# Patient Record
Sex: Male | Born: 1955 | Race: Black or African American | Hispanic: No | Marital: Married | Smoking: Current some day smoker
Health system: Southern US, Community
[De-identification: ages and names within clinical notes are randomized; demographics above are authoritative.]

## PROBLEM LIST (undated history)

## (undated) DIAGNOSIS — I10 Essential (primary) hypertension: Secondary | ICD-10-CM

## (undated) DIAGNOSIS — Z860100 Personal history of colon polyps, unspecified: Secondary | ICD-10-CM

## (undated) DIAGNOSIS — K219 Gastro-esophageal reflux disease without esophagitis: Secondary | ICD-10-CM

## (undated) DIAGNOSIS — C61 Malignant neoplasm of prostate: Secondary | ICD-10-CM

## (undated) DIAGNOSIS — Z9889 Other specified postprocedural states: Secondary | ICD-10-CM

## (undated) HISTORY — DX: Gastro-esophageal reflux disease without esophagitis: K21.9

## (undated) HISTORY — DX: Essential (primary) hypertension: I10

## (undated) HISTORY — PX: DENTAL SURGERY: SHX609

## (undated) HISTORY — DX: Personal history of colonic polyps: Z86.010

## (undated) HISTORY — PX: INSERTION PROSTATE RADIATION SEED: SUR718

## (undated) HISTORY — DX: Malignant neoplasm of prostate: C61

## (undated) HISTORY — DX: Personal history of colon polyps, unspecified: Z86.0100

## (undated) HISTORY — DX: Other specified postprocedural states: Z98.890

---

## 2013-06-22 ENCOUNTER — Other Ambulatory Visit: Payer: Self-pay | Admitting: *Deleted

## 2013-06-22 ENCOUNTER — Other Ambulatory Visit: Payer: Self-pay | Admitting: Specialist

## 2013-06-22 DIAGNOSIS — C61 Malignant neoplasm of prostate: Secondary | ICD-10-CM

## 2013-06-28 ENCOUNTER — Ambulatory Visit
Admission: RE | Admit: 2013-06-28 | Discharge: 2013-06-28 | Disposition: A | Discharge status: Home / Self Care | Source: Ambulatory Visit | Attending: *Deleted | Admitting: *Deleted

## 2013-06-28 DIAGNOSIS — C61 Malignant neoplasm of prostate: Secondary | ICD-10-CM

## 2015-04-04 ENCOUNTER — Encounter: Payer: Self-pay | Admitting: Gastroenterology

## 2015-05-13 ENCOUNTER — Ambulatory Visit (AMBULATORY_SURGERY_CENTER): Payer: Self-pay

## 2015-05-13 VITALS — Ht 69.0 in | Wt 175.0 lb

## 2015-05-13 DIAGNOSIS — Z860100 Personal history of colon polyps, unspecified: Secondary | ICD-10-CM

## 2015-05-13 NOTE — Progress Notes (Signed)
No allergies to eggs or soy No past problems with anesthesia No diet meds No home oxygen  Has email and internet; refused emmi

## 2015-05-14 ENCOUNTER — Encounter: Payer: Self-pay | Admitting: Gastroenterology

## 2015-05-27 ENCOUNTER — Encounter: Payer: Self-pay | Admitting: Gastroenterology

## 2015-05-27 ENCOUNTER — Ambulatory Visit (AMBULATORY_SURGERY_CENTER): Admitting: Gastroenterology

## 2015-05-27 VITALS — BP 113/64 | HR 70 | Temp 97.5°F | Resp 20 | Ht 69.0 in | Wt 175.0 lb

## 2015-05-27 DIAGNOSIS — D123 Benign neoplasm of transverse colon: Secondary | ICD-10-CM

## 2015-05-27 DIAGNOSIS — D122 Benign neoplasm of ascending colon: Secondary | ICD-10-CM | POA: Diagnosis not present

## 2015-05-27 DIAGNOSIS — Z86010 Personal history of colonic polyps: Secondary | ICD-10-CM

## 2015-05-27 DIAGNOSIS — D120 Benign neoplasm of cecum: Secondary | ICD-10-CM

## 2015-05-27 MED ORDER — SODIUM CHLORIDE 0.9 % IV SOLN: 500.0000 mL | INTRAVENOUS | Status: DC

## 2015-05-27 NOTE — Progress Notes (Signed)
Called to room to assist during endoscopic procedure.  Patient ID and intended procedure confirmed with present staff. Received instructions for my participation in the procedure from the performing physician.  

## 2015-05-27 NOTE — Patient Instructions (Signed)
YOU HAD AN ENDOSCOPIC PROCEDURE TODAY AT [facility]:   Refer to the procedure report that was given to you for any specific questions about what was found during the examination.  If the procedure report does not answer your questions, please call your gastroenterologist to clarify.  If you requested that your care partner not be given the details of your procedure findings, then the procedure report has been included in a sealed envelope for you to review at your convenience later.  YOU SHOULD EXPECT: Some feelings of bloating in the abdomen. Passage of more gas than usual.  Walking can help get rid of the air that was put into your GI tract during the procedure and reduce the bloating. If you had a lower endoscopy (such as a colonoscopy or flexible sigmoidoscopy) you may notice spotting of blood in your stool or on the toilet paper. If you underwent a bowel prep for your procedure, you may not have a normal bowel movement for a few days.  Please Note:  You might notice some irritation and congestion in your nose or some drainage.  This is from the oxygen used during your procedure.  There is no need for concern and it should clear up in a day or so.  SYMPTOMS TO REPORT IMMEDIATELY:   Following lower endoscopy (colonoscopy or flexible sigmoidoscopy):  Excessive amounts of blood in the stool  Significant tenderness or worsening of abdominal pains  Swelling of the abdomen that is new, acute  Fever of 100F or higher   For urgent or emergent issues, a gastroenterologist can be reached at any hour by calling [phone].   DIET: Your first meal following the procedure should be a small meal and then it is ok to progress to your normal diet. Heavy or fried foods are harder to digest and may make you feel nauseous or bloated.  Likewise, meals heavy in dairy and vegetables can increase bloating.  Drink plenty of fluids but you should avoid alcoholic beverages for 24  hours.  ACTIVITY:  You should plan to take it easy for the rest of today and you should NOT DRIVE or use heavy machinery until tomorrow (because of the sedation medicines used during the test).    FOLLOW UP: Our staff will call the number listed on your records the next business day following your procedure to check on you and address any questions or concerns that you may have regarding the information given to you following your procedure. If we do not reach you, we will leave a message.  However, if you are feeling well and you are not experiencing any problems, there is no need to return our call.  We will assume that you have returned to your regular daily activities without incident.  If any biopsies were taken you will be contacted by phone or by letter within the next 1-3 weeks.  Please call us at [phone] if you have not heard about the biopsies in 3 weeks.    SIGNATURES/CONFIDENTIALITY: You and/or your care partner have signed paperwork which will be entered into your electronic medical record.  These signatures attest to the fact that that the information above on your After Visit Summary has been reviewed and is understood.  Full responsibility of the confidentiality of this discharge information lies with you and/or your care-partner.  No aspirin, ibuprofen, naproxen, aleve, or other non-steroidal anti-inflammatory drugs for 2 weeks after polyp removal. Please read polyp and hemorrhoid handouts provided.

## 2015-05-27 NOTE — Op Note (Signed)
[facility] Patient Name: Gerald Patterson Procedure Date: 05/27/2015 1:59 PM MRN: [id number] Endoscopist: Remo Lipps P. Havery Moros , MD Age: 60 Referring MD:  Date of Birth: 10/15/1955 Gender: Male Procedure:                Colonoscopy Indications:              Screening for malignant neoplasm in the colon Medicines:                Monitored Anesthesia Care Procedure:                Pre-Anesthesia Assessment:                           - Prior to the procedure, a History and Physical                            was performed, and patient medications and                            allergies were reviewed. The patient's tolerance of                            previous anesthesia was also reviewed. The risks                            and benefits of the procedure and the sedation                            options and risks were discussed with the patient.                            All questions were answered, and informed consent                            was obtained. Prior Anticoagulants: The patient has                            taken no previous anticoagulant or antiplatelet                            agents. ASA Grade Assessment: II - A patient with                            mild systemic disease. After reviewing the risks                            and benefits, the patient was deemed in                            satisfactory condition to undergo the procedure.                           After obtaining informed consent, the colonoscope  was passed under direct vision. Throughout the                            procedure, the patient's blood pressure, pulse, and                            oxygen saturations were monitored continuously. The                            Model CF-HQ190L [id number]) scope was introduced                            through the anus and advanced to the the cecum,                            identified by appendiceal orifice and  ileocecal                            valve. The colonoscopy was performed without                            difficulty. The patient tolerated the procedure                            well. The quality of the bowel preparation was                            good. The ileocecal valve, appendiceal orifice, and                            rectum were photographed. Scope In: 2:11:44 PM Scope Out: 2:29:32 PM Scope Withdrawal Time: 0 hours 14 minutes 14 seconds  Total Procedure Duration: 0 hours 17 minutes 48 seconds  Findings:                 The perianal and digital rectal examinations were                            normal.                           Multiple medium-sized patchy angiodysplastic                            lesions without bleeding were found in the rectum.                           A 5 mm polyp was found in the ascending colon. The                            polyp was sessile. The polyp was removed with a                            cold snare. Resection and retrieval were complete.  Two sessile polyps were found in the transverse                            colon. The polyps were 3 mm in size. These polyps                            were removed with a cold snare. Resection and                            retrieval were complete.                           Non-bleeding internal hemorrhoids were found during                            retroflexion.                           The exam was otherwise without abnormality. Complications:            No immediate complications. Estimated blood loss:                            Minimal. Estimated Blood Loss:     Estimated blood loss was minimal. 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 Resected and retrieved.                           - Two 3 mm polyps in the transverse colon, removed                            with a cold snare. Resected and retrieved.                           - Non-bleeding internal hemorrhoids.                           - The examination was otherwise normal. Recommendation:           - Patient has a contact number available  for                            emergencies. The signs and symptoms of potential                            delayed complications were discussed with the                            patient. Return to normal activities tomorrow.                            Written discharge instructions were provided to the                            patient.                           - Resume previous diet.                           - Continue present medications.                           - No aspirin, ibuprofen, naproxen, or other                            non-steroidal anti-inflammatory drugs for 2 weeks                            after polyp removal.                           - Await pathology results.                           - Repeat colonoscopy is recommended for                            surveillance. The colonoscopy date will be                            determined after pathology results from today's                            exam become available for review.                           - If you have symptoms of rectal bleeding from                            angiodysplasias noted on this exam, we can                            coordinate a follow up procedure for APC albation  to treat this condition Remo Lipps P. Havery Moros, MD 05/27/2015 2:34:32 PM This report has been signed electronically.

## 2015-05-27 NOTE — Progress Notes (Signed)
A/ox3, pleased with MAC, report to RN 

## 2015-05-28 ENCOUNTER — Telehealth: Payer: Self-pay

## 2015-05-28 NOTE — Telephone Encounter (Signed)
Left message on answering machine. 

## 2015-05-31 ENCOUNTER — Encounter: Payer: Self-pay | Admitting: Gastroenterology

## 2019-03-17 ENCOUNTER — Ambulatory Visit: Attending: Internal Medicine

## 2019-03-17 DIAGNOSIS — Z23 Encounter for immunization: Secondary | ICD-10-CM

## 2019-03-17 NOTE — Progress Notes (Signed)
   Covid-19 Vaccination Clinic  Name:  Avon Brahmbhatt    MRN: [id number] DOB: [birth year]  03/17/2019  Mr. Zheng was observed post Covid-19 immunization for 15 minutes without incident. He was provided with Vaccine Information Sheet and instruction to access the V-Safe system.   Mr. Isaguirre was instructed to call 911 with any severe reactions post vaccine: Marland Kitchen Difficulty breathing  . Swelling of face and throat  . A fast heartbeat  . A bad rash all over body  . Dizziness and weakness   Immunizations Administered    Name Date Dose VIS Date Route   Pfizer COVID-19 Vaccine 03/17/2019  1:23 PM 0.3 mL 12/16/2018 Intramuscular   Manufacturer: [place]   Lot: [id number]   [place]: [id number]

## 2019-04-12 ENCOUNTER — Ambulatory Visit: Attending: Internal Medicine

## 2019-04-12 DIAGNOSIS — Z23 Encounter for immunization: Secondary | ICD-10-CM

## 2019-04-12 NOTE — Progress Notes (Signed)
   Covid-19 Vaccination Clinic  Name:  Gerald Patterson    MRN: [id number] DOB: 12/21/1955  04/12/2019  Gerald Patterson was observed post Covid-19 immunization for 15 minutes without incident. He was provided with Vaccine Information Sheet and instruction to access the V-Safe system.   Gerald Patterson was instructed to call 911 with any severe reactions post vaccine: Marland Kitchen Difficulty breathing  . Swelling of face and throat  . A fast heartbeat  . A bad rash all over body  . Dizziness and weakness   Immunizations Administered    Name Date Dose VIS Date Route   Pfizer COVID-19 Vaccine 04/12/2019  4:33 PM 0.3 mL 12/16/2018 Intramuscular   Manufacturer: [place]   Lot: [id number]   [place]: [id number]

## 2019-08-04 ENCOUNTER — Other Ambulatory Visit: Payer: Self-pay | Admitting: Ophthalmology

## 2019-08-04 ENCOUNTER — Other Ambulatory Visit: Payer: Self-pay | Admitting: Optometrist

## 2019-08-04 ENCOUNTER — Other Ambulatory Visit: Payer: Self-pay | Admitting: Surgery

## 2019-08-04 DIAGNOSIS — H47212 Primary optic atrophy, left eye: Secondary | ICD-10-CM

## 2019-08-04 DIAGNOSIS — Z77018 Contact with and (suspected) exposure to other hazardous metals: Secondary | ICD-10-CM

## 2019-09-05 ENCOUNTER — Ambulatory Visit
Admission: RE | Admit: 2019-09-05 | Discharge: 2019-09-05 | Disposition: A | Discharge status: Home / Self Care | Source: Ambulatory Visit | Attending: Ophthalmology | Admitting: Ophthalmology

## 2019-09-05 ENCOUNTER — Other Ambulatory Visit: Payer: Self-pay

## 2019-09-05 DIAGNOSIS — Z77018 Contact with and (suspected) exposure to other hazardous metals: Secondary | ICD-10-CM

## 2019-09-05 DIAGNOSIS — H47212 Primary optic atrophy, left eye: Secondary | ICD-10-CM

## 2019-09-05 MED ORDER — GADOBENATE DIMEGLUMINE 529 MG/ML IV SOLN
15.0000 mL | Freq: Once | INTRAVENOUS | Status: AC | PRN
  Administered 2019-09-05: 15 mL via INTRAVENOUS

## 2020-09-16 ENCOUNTER — Encounter: Payer: Self-pay | Admitting: Gastroenterology

## 2021-02-27 IMAGING — MR MR HEAD WO/W CM
13 series · 48 of 48 positions shown · IV contrast (multihance)
Comparison: Orbit radiographs the same day.

CLINICAL DATA: 64-year-old male with primary optic atrophy on the
left. Pressure behind the left eye.

EXAM:
MRI HEAD AND ORBITS WITHOUT AND WITH CONTRAST
TECHNIQUE: Multiplanar, multiecho pulse sequences of the brain and surrounding
structures were obtained without and with intravenous contrast.
Multiplanar, multiecho pulse sequences of the orbits and surrounding
structures were obtained including fat saturation techniques, before
and after intravenous contrast administration.
CONTRAST:  15mL MULTIHANCE GADOBENATE DIMEGLUMINE 529 MG/ML IV SOLN

[Series 2: t1_se_sag · sagittal · 5.0mm · 0.47mm/px · 1 of 26 slices shown]
[im 1/26]
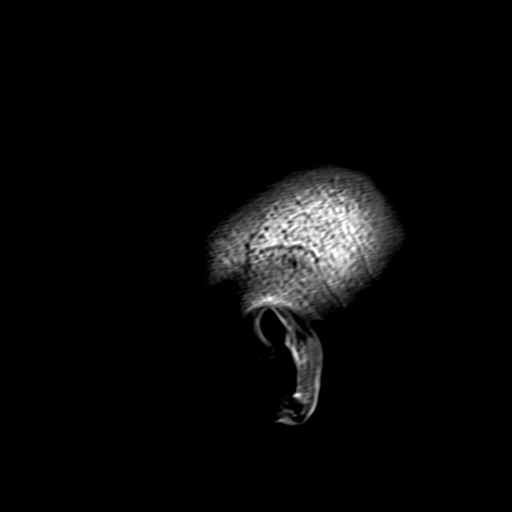

[Series 3: ep2d_diff_3 · axial · 3.0mm · 1.80mm/px · z∈[-20,+139]mm · 6 of 106 slices shown]
[im 1/106]
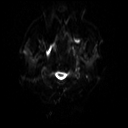
[im 22/106]
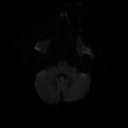
[im 43/106]
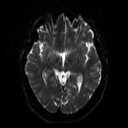
[im 64/106]
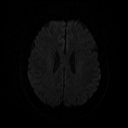
[im 85/106]
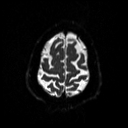
[im 106/106]
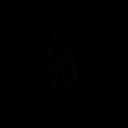

[Series 4: ep2d_diff_3_adc · axial · 3.0mm · 1.80mm/px · z∈[-20,+139]mm · 3 of 53 slices shown]
[im 1/53]
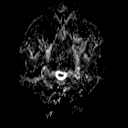
[im 27/53]
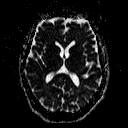
[im 53/53]
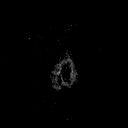

[Series 5: ep2d_diff_cor · coronal · 5.0mm · 1.77mm/px · 4 of 62 slices shown]
[im 1/62]
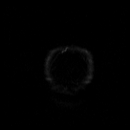
[im 21/62]
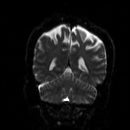
[im 41/62]
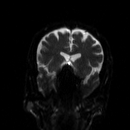
[im 62/62]
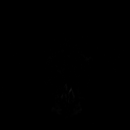

[Series 6: ep2d_diff_cor_adc · coronal · 5.0mm · 1.77mm/px · 2 of 31 slices shown]
[im 1/31]
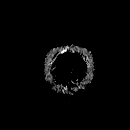
[im 31/31]
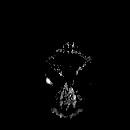

[Series 8: swi_images · axial · 2.0mm · 0.90mm/px · z∈[-13,+129]mm · 4 of 72 slices shown]
[im 1/72]
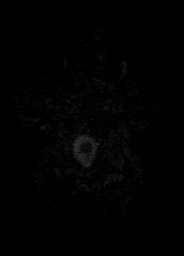
[im 24/72]
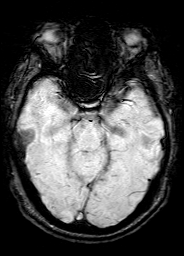
[im 48/72]
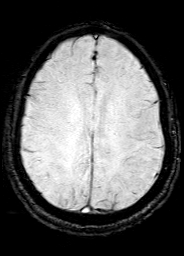
[im 72/72]
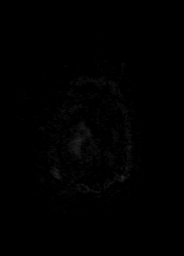

[Series 9: FLAIR · axial · 3.0mm · 0.43mm/px · z∈[-28,+140]mm · 2 of 29 slices shown]
[im 1/29]
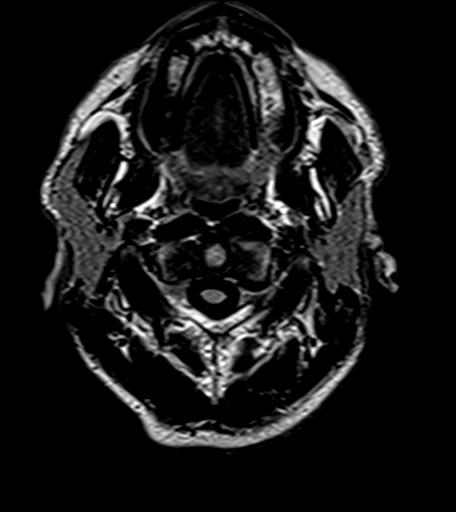
[im 29/29]
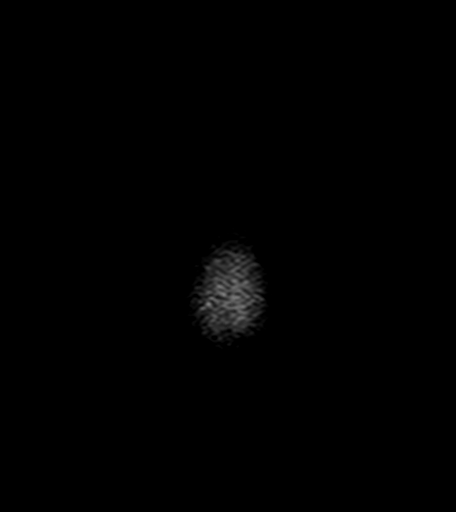

[Series 10: t2_tse_tra_512 · axial · 5.0mm · 0.60mm/px · z∈[-26,+142]mm · 2 of 29 slices shown]
[im 1/29]
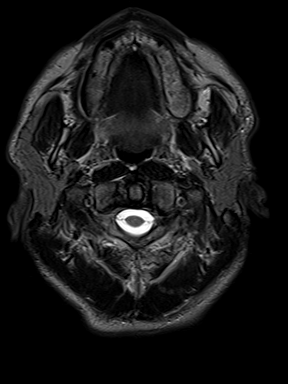
[im 29/29]
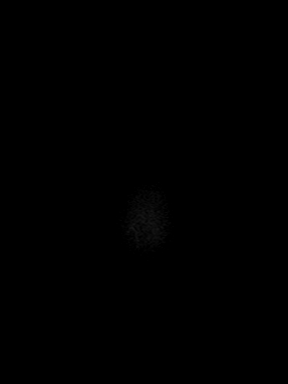

[Series 11: t1_mpr_tra · axial · 1.0mm · 0.72mm/px · z∈[-28,+146]mm · 10 of 176 slices shown]
[im 1/176]
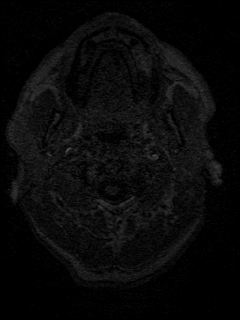
[im 20/176]
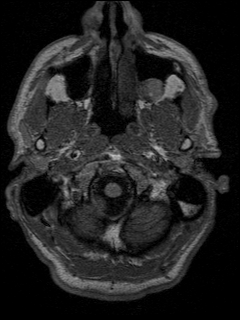
[im 39/176]
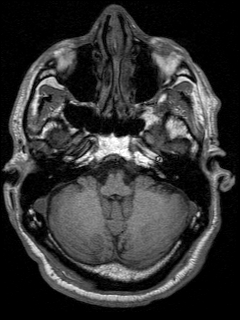
[im 59/176]
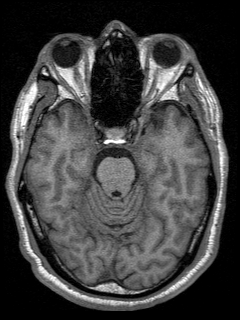
[im 78/176]
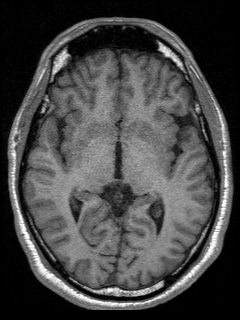
[im 98/176]
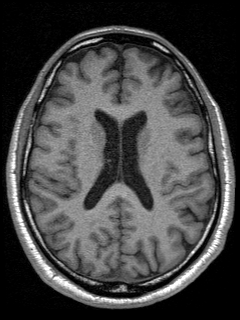
[im 117/176]
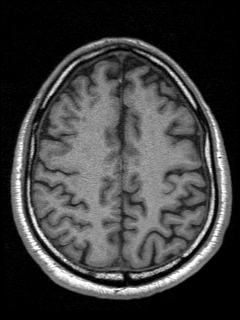
[im 137/176]
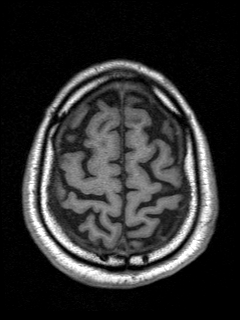
[im 156/176]
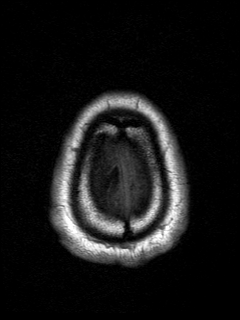
[im 176/176]
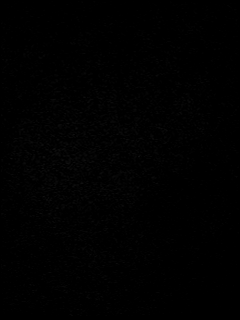

[Series 12: T2 · coronal · 5.0mm · 0.45mm/px · 2 of 34 slices shown]
[im 1/34]
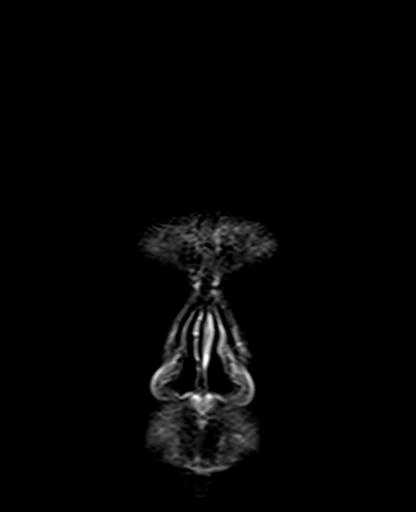
[im 34/34]
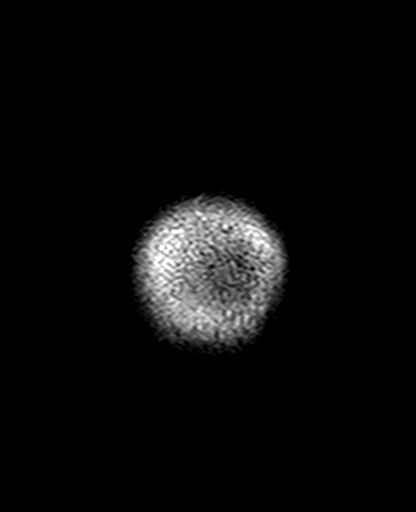

[Series 13: post t1_mpr_tra · axial · 1.0mm · 0.72mm/px · z∈[-20,+139]mm · 9 of 160 slices shown]
[im 1/160]
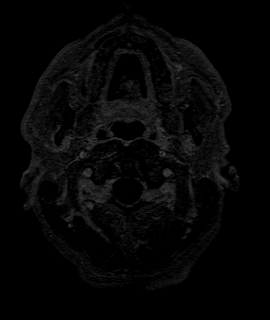
[im 20/160]
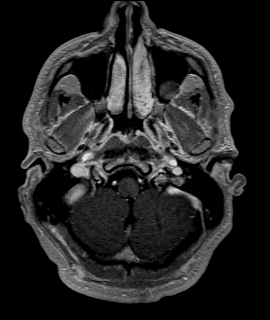
[im 40/160]
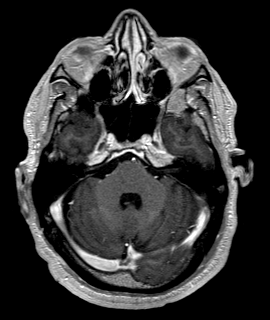
[im 60/160]
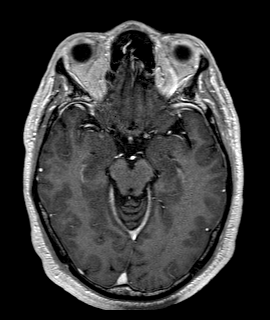
[im 80/160]
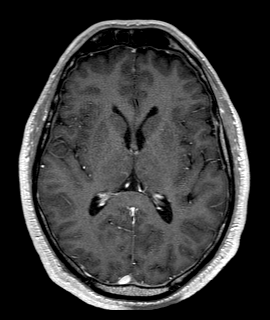
[im 100/160]
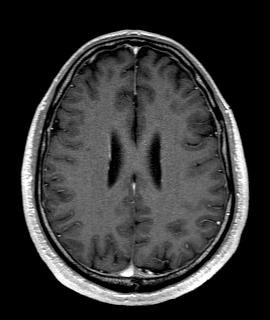
[im 120/160]
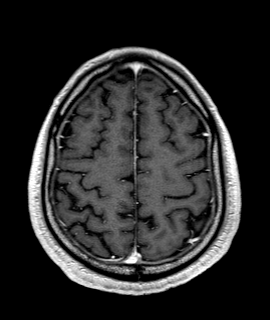
[im 140/160]
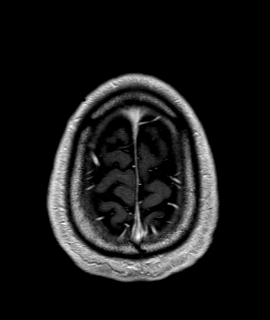
[im 160/160]
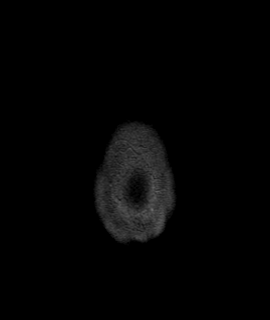

[Series 14: T1 post-contrast · coronal · 5.0mm · 0.45mm/px · 2 of 34 slices shown (1 of 2)]
[im 1/34]
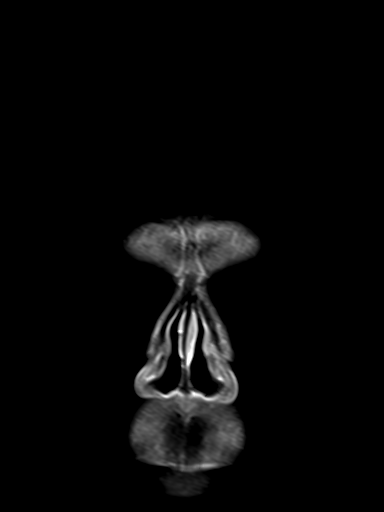
[im 34/34]
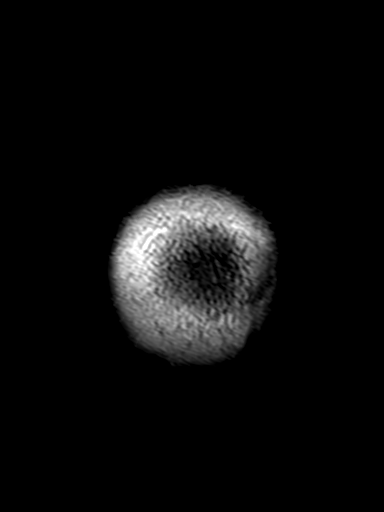

[Series 15: T1 post-contrast · sagittal · 5.0mm · 0.47mm/px · 1 of 26 slices shown (2 of 2)]
[im 1/26]
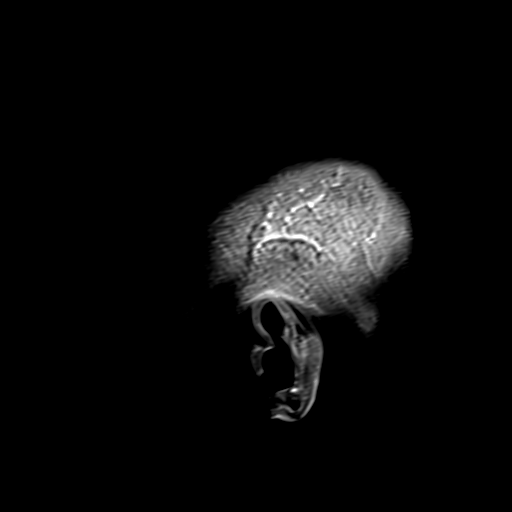

[48 of 48 positions shown; findings below may reference images not displayed]

FINDINGS: MRI HEAD FINDINGS

Brain: Cerebral volume is within normal limits for age. No
restricted diffusion to suggest acute infarction. No midline shift,
mass effect, evidence of mass lesion, ventriculomegaly, extra-axial
collection or acute intracranial hemorrhage. Cervicomedullary
junction and pituitary are within normal limits.

Mild for age scattered mostly subcortical white matter small T2 and
FLAIR hyperintense foci, nonspecific. Possible chronic
microhemorrhage in the posterior left hemisphere near the junctions
of the posterior temporal and occipital lobes on series 8, image 31.
But no cortical encephalomalacia or other chronic cerebral blood
products are identified. Deep gray nuclei, brainstem and cerebellum
appear negative.

No abnormal enhancement identified. No dural thickening.

Vascular: Major intracranial vascular flow voids are preserved. The
major dural venous sinuses are enhancing and appear to be patent.

Skull and upper cervical spine: Visible cervical spine is normal for
age. Normal visible bone marrow signal.

Other: Mastoids are well pneumatized. Visible internal auditory
structures appear normal. Scalp and face soft tissues appear
negative.

MRI ORBITS FINDINGS

Orbits: Normal suprasellar cistern. Unremarkable optic chiasm. The
cavernous sinus appears symmetric and within normal limits.

Asymmetrically decreased size of the left optic nerve (series 2,
image 15) and evidence of asymmetric T2 signal in or around the
posterior segment of the nerve (image 11). No superimposed
intraorbital mass or inflammation. No abnormal intraorbital
enhancement. Other intraorbital soft tissues including the
extraocular muscles and lacrimal glands appear symmetric and within
normal limits.

Mild motion artifact both globes on many of the sequences, but
grossly normal globes, with no abnormal enhancement.

Visualized sinuses: Small maxillary sinus mucous retention cysts
greater on the left. Other paranasal sinuses are well pneumatized.

Soft tissues: Superficial periorbital soft tissues and the visible
deep soft tissue spaces of the face are within normal limits; there
is a small nasopharyngeal retention cyst (normal variant series 13,
image 17).
IMPRESSION: 1. Evidence of left optic nerve atrophy, but no associated optic
pathway mass or inflammation. Negative MRI appearance of the right
orbit.

2. No acute intracranial abnormality. Mild for age nonspecific
cerebral white matter signal changes and solitary chronic
microhemorrhage in the left hemisphere, most commonly due to chronic
small vessel disease.

## 2022-01-05 DEATH — deceased

## 2022-10-02 ENCOUNTER — Encounter: Payer: Self-pay | Admitting: Internal Medicine

## 2022-10-02 ENCOUNTER — Ambulatory Visit: Payer: Medicare Other | Attending: Internal Medicine | Admitting: Internal Medicine

## 2022-10-02 VITALS — BP 108/70 | HR 72 | Ht 69.0 in | Wt 169.0 lb

## 2022-10-02 DIAGNOSIS — R42 Dizziness and giddiness: Secondary | ICD-10-CM | POA: Diagnosis present

## 2022-10-02 DIAGNOSIS — I10 Essential (primary) hypertension: Secondary | ICD-10-CM | POA: Diagnosis not present

## 2022-10-02 DIAGNOSIS — R079 Chest pain, unspecified: Secondary | ICD-10-CM

## 2022-10-02 NOTE — Patient Instructions (Addendum)
Medication Instructions:  Your physician recommends that you continue on your current medications as directed. Please refer to the Current Medication list given to you today.  *If you need a refill on your cardiac medications before your next appointment, please call your pharmacy*   Lab Work: NONE If you have labs (blood work) drawn today and your tests are completely normal, you will receive your results only by: MyChart Message (if you have MyChart) OR A paper copy in the mail If you have any lab test that is abnormal or we need to change your treatment, we will call you to review the results.   Testing/Procedures: NONE   Follow-Up: At [facility], you and your health needs are our priority.  As part of our continuing mission to provide you with exceptional heart care, we have created designated Provider Care Teams.  These Care Teams include your primary Cardiologist (physician) and Advanced Practice Providers (APPs -  Physician Assistants and Nurse Practitioners) who all work together to provide you with the care you need, when you need it.  We recommend signing up for the patient portal called "MyChart".  Sign up information is provided on this After Visit Summary.  MyChart is used to connect with patients for Virtual Visits (Telemedicine).  Patients are able to view lab/test results, encounter notes, upcoming appointments, etc.  Non-urgent messages can be sent to your provider as well.   To learn more about what you can do with MyChart, go to [email or web address].    Your next appointment:   6 month(s)  Provider:   DR. MARY BRANCH   Other Instructions  Vertigo Vertigo is the feeling that you or the things around you are moving when they are not. This feeling can come and go at any time. Vertigo often goes away on its own. This condition can be dangerous if it happens when you are doing activities like driving or working with machines. Your doctor will do tests to  find the cause of your vertigo. These tests will also help your doctor decide on the best treatment for you. Follow these instructions at home: Eating and drinking     Drink enough fluid to keep your pee (urine) pale yellow. Do not drink alcohol. Activity Return to your normal activities when your doctor says that it is safe. In the morning, first sit up on the side of the bed. When you feel okay, stand slowly while you hold onto something until you know that your balance is fine. Move slowly. Avoid sudden body or head movements or certain positions, as told by your doctor. Use a cane if you have trouble standing or walking. Sit down right away if you feel dizzy. Avoid doing any tasks or activities that can cause danger to you or others if you get dizzy. Avoid bending down if you feel dizzy. Place items in your home so that they are easy for you to reach without bending or leaning over. Do not drive or use machinery if you feel dizzy. General instructions Take over-the-counter and prescription medicines only as told by your doctor. Keep all follow-up visits. Contact a doctor if: Your medicine does not help your vertigo. Your problems get worse or you have new symptoms. You have a fever. You feel like you may vomit (nauseous), or this feeling gets worse. You start to vomit. Your family or friends see changes in how you act. You lose feeling (have numbness) in part of your body. You feel prickling and tingling  in a part of your body. Get help right away if: You are always dizzy. You faint. You get very bad headaches. You get a stiff neck. Bright light starts to bother you. You have trouble moving or talking. You feel weak in your hands, arms, or legs. You have changes in your hearing or in how you see (vision). These symptoms may be an emergency. Get help right away. Call your local emergency services (911 in the [place] Do not wait to see if the symptoms will go away. Do not  drive yourself to the hospital. Summary Vertigo is the feeling that you or the things around you are moving when they are not. Your doctor will do tests to find the cause of your vertigo. You may be told to avoid some tasks, positions, or movements. Contact a doctor if your medicine is not helping, or if you have a fever, new symptoms, or a change in how you act. Get help right away if you get very bad headaches, or if you have changes in how you speak, hear, or see. This information is not intended to replace advice given to you by your health care provider. Make sure you discuss any questions you have with your health care provider. Document Revised: 11/22/2019 Document Reviewed: 11/22/2019 Elsevier Patient Education  2024 [employer].

## 2023-03-25 ENCOUNTER — Ambulatory Visit: Payer: TRICARE For Life (TFL) | Admitting: Internal Medicine

## 2023-03-25 ENCOUNTER — Telehealth: Payer: Self-pay

## 2023-03-25 NOTE — Telephone Encounter (Signed)
 Called patient to obtain verbal consent for video visit. Patient decided to cancel appointment and call our office when he's ready to schedule follow up appointment.  Josie LPN

## 2023-03-25 NOTE — Progress Notes (Deleted)
  Cardiology Office Note:  .   Date:  03/25/2023  ID:  Gerald Patterson, DOB Feb 25, 1955, MRN [id number] PCP: Gerald Amos, FNP  [facility] Providers Cardiologist:  None    History of Present Illness: .   Gerald Patterson is a 68 y.o. male  referral from PCP, Dayton Scrape FNP for  HTN. Blood pressure is well controlled today on norvasc 5 mg daily and losartan 25 mg daily. He also reports CP. He states he had 5 minutes of chest pain while sleeping. He denies recurrence. He runs 8 miles per week without CP or SOB. He is a former smoker but quit 10 years ago. He has family hx of CM. His Aunt had heart failure and her son had a heart transplant around 31 , he is deceased.  He has had no prior cardiac w/u He also reports dizziness with standing. No LOC. He states he hydrates well.  ROS:  per HPI otherwise negative  Interval hx 03/25/2023    Risk Assessment/Calculations:        Physical Exam:   VS:  ***  Wt Readings from Last 3 Encounters:  10/02/22 169 lb (76.7 kg)  05/27/15 175 lb (79.4 kg)  05/13/15 175 lb (79.4 kg)    GEN: Well nourished, well developed in no acute distress NECK: No JVD; No carotid bruits CARDIAC: RRR, no murmurs, rubs, gallops RESPIRATORY:  Clear to auscultation without rales, wheezing or rhonchi  ABDOMEN: Soft, non-tender, non-distended EXTREMITIES:  No edema; No deformity   ASSESSMENT AND PLAN: .   #CP - likely non cardiac CP.  He denies symptoms with activity and the duration was short.  -could have been reflux.  #Dizziness - recommend continue hydration , 64 oz per day - could be vertigo, I provided information about vertigo today. Recommended dramamine   #HTN - well controlled. Continue current therapy - continue great exercise routine    Dispo: Follow up PRN  Signed, Maisie Fus, MD
